# Patient Record
Sex: Male | Born: 2000 | Race: Black or African American | Hispanic: No | Marital: Single | Smoking: Never smoker
Health system: Southern US, Community
[De-identification: ages and names within clinical notes are randomized; demographics above are authoritative.]

## PROBLEM LIST (undated history)

## (undated) DIAGNOSIS — F909 Attention-deficit hyperactivity disorder, unspecified type: Secondary | ICD-10-CM

## (undated) DIAGNOSIS — K219 Gastro-esophageal reflux disease without esophagitis: Secondary | ICD-10-CM

## (undated) DIAGNOSIS — F32A Depression, unspecified: Secondary | ICD-10-CM

## (undated) DIAGNOSIS — H0014 Chalazion left upper eyelid: Secondary | ICD-10-CM

## (undated) DIAGNOSIS — H539 Unspecified visual disturbance: Secondary | ICD-10-CM

## (undated) DIAGNOSIS — T7840XA Allergy, unspecified, initial encounter: Secondary | ICD-10-CM

## (undated) DIAGNOSIS — F419 Anxiety disorder, unspecified: Secondary | ICD-10-CM

## (undated) DIAGNOSIS — I10 Essential (primary) hypertension: Secondary | ICD-10-CM

## (undated) DIAGNOSIS — J45909 Unspecified asthma, uncomplicated: Secondary | ICD-10-CM

## (undated) DIAGNOSIS — R51 Headache: Secondary | ICD-10-CM

## (undated) HISTORY — DX: Anxiety disorder, unspecified: F41.9

## (undated) HISTORY — DX: Depression, unspecified: F32.A

## (undated) HISTORY — PX: TONSILLECTOMY AND ADENOIDECTOMY: SUR1326

## (undated) HISTORY — DX: Gastro-esophageal reflux disease without esophagitis: K21.9

## (undated) HISTORY — DX: Unspecified asthma, uncomplicated: J45.909

## (undated) HISTORY — DX: Essential (primary) hypertension: I10

## (undated) HISTORY — DX: Allergy, unspecified, initial encounter: T78.40XA

---
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
## 2022-02-23 ENCOUNTER — Ambulatory Visit: Payer: Medicaid Other | Admitting: Allergy & Immunology

## 2022-03-17 ENCOUNTER — Ambulatory Visit (INDEPENDENT_AMBULATORY_CARE_PROVIDER_SITE_OTHER): Payer: Medicaid Other | Admitting: Psychiatry

## 2022-03-17 ENCOUNTER — Encounter (HOSPITAL_COMMUNITY): Payer: Self-pay | Admitting: Psychiatry

## 2022-03-17 DIAGNOSIS — F900 Attention-deficit hyperactivity disorder, predominantly inattentive type: Secondary | ICD-10-CM

## 2022-03-17 DIAGNOSIS — F913 Oppositional defiant disorder: Secondary | ICD-10-CM | POA: Diagnosis not present

## 2022-03-17 MED ORDER — METHYLPHENIDATE HCL ER (OSM) 36 MG PO TBCR: 36.0000 mg | EXTENDED_RELEASE_TABLET | ORAL | 0 refills | Status: DC

## 2022-03-17 MED ORDER — METHYLPHENIDATE HCL ER (OSM) 36 MG PO TBCR
36.0000 mg | EXTENDED_RELEASE_TABLET | Freq: Every day | ORAL | 0 refills | Status: DC
Start: 2022-03-17 — End: 2022-07-13

## 2022-03-17 NOTE — Progress Notes (Signed)
Virtual Visit via Telephone Note  I connected with Richard Bolton on 03/17/22 at  8:40 AM EST by telephone and verified that I am speaking with the correct person using two identifiers.  Location: Patient: home Provider: home office   I discussed the limitations, risks, security and privacy concerns of performing an evaluation and management service by telephone and the availability of in person appointments. I also discussed with the patient that there may be a patient responsible charge related to this service. The patient expressed understanding and agreed to proceed.     I discussed the assessment and treatment plan with the patient. The patient was provided an opportunity to ask questions and all were answered. The patient agreed with the plan and demonstrated an understanding of the instructions.   The patient was advised to call back or seek an in-person evaluation if the symptoms worsen or if the condition fails to improve as anticipated.  I provided 15 minutes of non-face-to-face time during this encounter.   Diannia Ruder, MD  [facility] MD/PA/NP OP Progress Note  03/17/2022 9:05 AM Richard Bolton  MRN:  [id number]  Chief Complaint:  Chief Complaint  Patient presents with   ADHD   Depression   Follow-up   HPI: This patient is a 21 year old black male who lives with his parents and 8 year old brother in [place].  He is a Printmaker at [employer].  The patient and father return for follow-up after 3 months.  According to the father the patient is not doing well.  He is disobedient disrespectful.  He has been cursing at his parents.  He got a speeding ticket.  His friend's mother has pain to the money to get an attorney foot to help with the ticket.  He is not working and not contributing financially.  He denies significant depression or anxiety and states that he stopped the Prozac several months ago on it and he does not feel any better or worse.  He claims he is taking the  Concerta but his father is not so sure.  He claims he is getting B's and C's in school but again the father does not know because it he will reveal his grades.  He states that he is sleeping well.  It sounds as if he is not handling his freedom very well and the parents need to put in more stricter controls.  The father is working on this.  We will also get him back into counseling. Visit Diagnosis:    ICD-10-CM   1. Attention deficit hyperactivity disorder (ADHD), predominantly inattentive type  F90.0     2. Oppositional defiant disorder  F91.3       Past Psychiatric History: Past outpatient treatment for ADHD  Past Medical History:  Past Medical History:  Diagnosis Date   ADHD (attention deficit hyperactivity disorder)    Allergy    Phreesia 03/23/2020   Anxiety    Phreesia 05/05/2020   Asthma    Asthma    Phreesia 03/23/2020   Chalazion of left upper eyelid    Constipation    Depression    Phreesia 03/23/2020   Depression    Phreesia 05/05/2020   Gastroesophageal reflux    Headache(784.0)    Hypertension    Phreesia 03/23/2020   Vision abnormalities    wears glasses    Past Surgical History:  Procedure Laterality Date   CHALAZION EXCISION  08/09/2011   Procedure: EXCISION CHALAZION;  Surgeon: Corinda Gubler, MD;  Location: [facility];  Service: Ophthalmology;  Laterality: Left;  chalazion excision and curettage on left upper lid under general anethesia   REVISION ADENOIDECTOMY / ATTEMPTED RIGHT MAXILLARY SINUS TAP  06-25-2008   CHRONIC SINUSITIS/ ADENOID HYPERTROPHY   TONSILLECTOMY AND ADENOIDECTOMY      Family Psychiatric History: See below  Family History:  Family History  Problem Relation Age of Onset   Depression Mother    Anxiety disorder Mother    GER disease Father    Hypertension Father    Hydrocephalus Father        Has shunt   Allergic rhinitis Father    Asthma Brother    Allergic rhinitis Brother    ADD / ADHD Brother     Autism Brother    Arthritis Maternal Aunt    Diabetes Maternal Aunt    Hypertension Maternal Aunt    Learning disabilities Maternal Aunt    Bipolar disorder Paternal Aunt    Bipolar disorder Paternal Aunt    Schizophrenia Paternal Aunt    Asthma Maternal Grandmother    Diabetes Maternal Grandmother    Hypertension Maternal Grandmother    Kidney disease Maternal Grandmother    Alcohol abuse Maternal Grandfather    Heart disease Maternal Grandfather    Hyperlipidemia Paternal Grandmother    Allergic rhinitis Paternal Grandmother    ADD / ADHD Cousin    ADD / ADHD Cousin     Social History:  Social History   Socioeconomic History   Marital status: Single    Spouse name: Not on file   Number of children: Not on file   Years of education: Not on file   Highest education level: Not on file  Occupational History   Not on file  Tobacco Use   Smoking status: Never    Passive exposure: Never   Smokeless tobacco: Never  Vaping Use   Vaping Use: Never used  Substance and Sexual Activity   Alcohol use: No   Drug use: No   Sexual activity: Never  Other Topics Concern   Not on file  Social History Narrative   Lives with mom, dad, brother, and 2 dogs   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    He would like to go to [employer] for [employer].    Social Determinants of Health   Financial Resource Strain: Not on file  Food Insecurity: Not on file  Transportation Needs: Not on file  Physical Activity: Not on file  Stress: Not on file  Social Connections: Not on file    Allergies: No Known Allergies  Metabolic Disorder Labs: No results found for: "HGBA1C", "MPG" No results found for: "PROLACTIN" No results found for: "CHOL", "TRIG", "HDL", "CHOLHDL", "VLDL", "LDLCALC" Lab Results  Component Value Date   TSH 1.55 12/04/2016    Therapeutic Level Labs: No results found for: "LITHIUM" No results found for: "VALPROATE" No results found for: "CBMZ"  Current  Medications: Current Outpatient Medications  Medication Sig Dispense Refill   albuterol (VENTOLIN HFA) 108 (90 Base) MCG/ACT inhaler Inhale 2 puffs into the lungs every 6 (six) hours as needed for wheezing or shortness of breath. 2 each 1   amitriptyline (ELAVIL) 25 MG tablet Take 1 tablet (25 mg total) by mouth at bedtime. 30 tablet 5   amLODipine (NORVASC) 5 MG tablet Take 5 mg by mouth daily.     EPINEPHrine (EPIPEN 2-PAK) 0.3 mg/0.3 mL IJ SOAJ injection Inject 0.3 mg into the muscle as needed for anaphylaxis. 4 each  1   fluticasone (FLOVENT HFA) 110 MCG/ACT inhaler Inhale 2 puffs into the lungs 2 (two) times daily. 12 g 5   hydrochlorothiazide (HYDRODIURIL) 12.5 MG tablet Take 12.5 mg by mouth daily.     ibuprofen (ADVIL) 200 MG tablet Take 3 tablets (600 mg total) by mouth every 6 (six) hours as needed for headache. 30 tablet 0   loratadine (CLARITIN) 10 MG tablet Take 1 tablet (10 mg total) by mouth daily as needed for allergies or rhinitis. 30 tablet 2   methylphenidate (CONCERTA) 36 MG PO CR tablet Take 1 tablet (36 mg total) by mouth daily. 30 tablet 0   methylphenidate (CONCERTA) 36 MG PO CR tablet Take 1 tablet (36 mg total) by mouth every morning. 30 tablet 0   methylphenidate (CONCERTA) 36 MG PO CR tablet Take 1 tablet (36 mg total) by mouth every morning. 30 tablet 0   No current facility-administered medications for this visit.     Musculoskeletal: Strength & Muscle Tone: na Gait & Station: na Patient leans: N/A  Psychiatric Specialty Exam: Review of Systems  Psychiatric/Behavioral:  Positive for behavioral problems.   All other systems reviewed and are negative.   There were no vitals taken for this visit.There is no height or weight on file to calculate BMI.  General Appearance: NA  Eye Contact:  NA  Speech:  Clear and Coherent  Volume:  Normal  Mood:  Irritable  Affect:  NA  Thought Process:  Goal Directed  Orientation:  Full (Time, Place, and Person)  Thought  Content: WDL   Suicidal Thoughts:  No  Homicidal Thoughts:  No  Memory:  Immediate;   Good Recent;   Good Remote;   NA  Judgement:  Poor  Insight:  Lacking  Psychomotor Activity:  Normal  Concentration:  Concentration: Fair and Attention Span: Fair  Recall:  [employer] of Knowledge: Fair  Language: Good  Akathisia:  No  Handed:  Right  AIMS (if indicated): not done  Assets:  Manufacturing systems engineer Physical Health Resilience Social Support  ADL's:  Intact  Cognition: Impaired,  Mild  Sleep:  Good   Screenings: PHQ2-9    Flowsheet Row [facility] Office Visit from 11/15/2021 [facility] Video Visit from 08/12/2021 [facility] Video Visit from 06/25/2020 [facility]  PHQ-2 Total Score 0 1 0 0      Flowsheet Row Office Visit from 12/16/2021 [facility] Office Visit from 11/15/2021 [facility] Video Visit from 08/12/2021 [facility]  [facility]-SSRS RISK CATEGORY No Risk No Risk No Risk        Assessment and Plan: Patient is a 21 year old male with a history of learning disabilities ADHD speech delays.  He claims he is no longer depressed.  He is trying to exercise as Freedom in a very negative way.  For now he will continue Concerta 36 mg every morning for ADHD.  Given that he is no longer child is difficult to force him to take medication.  He will return to see me in 3 months  Collaboration of Care: Collaboration of Care: Referral or follow-up with counselor/therapist AEB patient will restart therapy with Suzan Garibaldi in our office  Patient/Guardian was advised Release of Information must be obtained prior to any record release in order to  collaborate their care with an  outside provider. Patient/Guardian was advised if they have not already done so to contact the registration department to sign all necessary forms in order for [place] to release information regarding their care.   Consent: Patient/Guardian gives verbal consent for treatment and assignment of benefits for services provided during this visit. Patient/Guardian expressed understanding and agreed to proceed.    Diannia Ruder, MD 03/17/2022, 9:05 AM

## 2022-04-14 ENCOUNTER — Ambulatory Visit: Payer: Medicaid Other | Admitting: Allergy & Immunology

## 2022-04-17 ENCOUNTER — Ambulatory Visit (HOSPITAL_COMMUNITY): Payer: Medicaid Other | Admitting: Clinical

## 2022-06-10 ENCOUNTER — Other Ambulatory Visit: Payer: Self-pay

## 2022-06-10 ENCOUNTER — Encounter (HOSPITAL_COMMUNITY): Payer: Self-pay

## 2022-06-10 ENCOUNTER — Emergency Department (HOSPITAL_COMMUNITY)
Admission: EM | Admit: 2022-06-10 | Discharge: 2022-06-10 | Disposition: A | Discharge status: Home / Self Care | Payer: Medicaid Other | Attending: Emergency Medicine | Admitting: Emergency Medicine

## 2022-06-10 DIAGNOSIS — J029 Acute pharyngitis, unspecified: Secondary | ICD-10-CM

## 2022-06-10 DIAGNOSIS — J020 Streptococcal pharyngitis: Secondary | ICD-10-CM | POA: Diagnosis not present

## 2022-06-10 DIAGNOSIS — Z1152 Encounter for screening for COVID-19: Secondary | ICD-10-CM | POA: Diagnosis not present

## 2022-06-10 LAB — RESP PANEL BY RT-PCR (RSV, FLU A&B, COVID)  RVPGX2
Influenza A by PCR: NEGATIVE
Influenza B by PCR: NEGATIVE
Resp Syncytial Virus by PCR: NEGATIVE
SARS Coronavirus 2 by RT PCR: NEGATIVE

## 2022-06-10 LAB — GROUP A STREP BY PCR: Group A Strep by PCR: NOT DETECTED

## 2022-06-10 NOTE — ED Triage Notes (Signed)
Pt c/o sore throat that started yesterday with some nonproductive cough. Afebrile.

## 2022-06-10 NOTE — Discharge Instructions (Signed)
Your strep test and your respiratory panel are negative suggesting that your symptoms are secondary to a viral sore throat which should resolve on its own.  I do recommend taking ibuprofen or Tylenol to help you with any throat pain while this infection is run its course.

## 2022-06-10 NOTE — ED Provider Notes (Signed)
[facility] Provider Note   CSN: [id number] Arrival date & time: 06/10/22  [id number]     History  Chief Complaint  Patient presents with   Sore Throat    Richard Bolton is a 22 y.o. male.  The history is provided by the patient.  Sore Throat This is a new problem. The current episode started 12 to 24 hours ago. The problem occurs constantly. The problem has not changed since onset.Pertinent negatives include no chest pain, no abdominal pain and no shortness of breath. Associated symptoms comments: He denies nasal congestion, rhinorrhea, cough, shortness of breath, headaches or fever.. The symptoms are aggravated by swallowing. Nothing relieves the symptoms. He has tried nothing for the symptoms.       Home Medications Prior to Admission medications   Medication Sig Start Date End Date Taking? Authorizing Provider  albuterol (VENTOLIN HFA) 108 (90 Base) MCG/ACT inhaler Inhale 2 puffs into the lungs every 6 (six) hours as needed for wheezing or shortness of breath. 08/23/21   Valentina Shaggy, MD  amitriptyline (ELAVIL) 25 MG tablet Take 1 tablet (25 mg total) by mouth at bedtime. 05/12/21   Teressa Lower, MD  amLODipine (NORVASC) 5 MG tablet Take 5 mg by mouth daily. 11/12/19 11/11/20  [provider]  EPINEPHrine (EPIPEN 2-PAK) 0.3 mg/0.3 mL IJ SOAJ injection Inject 0.3 mg into the muscle as needed for anaphylaxis. 08/23/21   Valentina Shaggy, MD  fluticasone (FLOVENT HFA) 110 MCG/ACT inhaler Inhale 2 puffs into the lungs 2 (two) times daily. 08/23/21   Valentina Shaggy, MD  hydrochlorothiazide (HYDRODIURIL) 12.5 MG tablet Take 12.5 mg by mouth daily. 02/11/20 02/10/21  [provider]  ibuprofen (ADVIL) 200 MG tablet Take 3 tablets (600 mg total) by mouth every 6 (six) hours as needed for headache. 09/17/20   Georga Hacking, MD  loratadine (CLARITIN) 10 MG tablet Take 1 tablet (10 mg total) by mouth daily as  needed for allergies or rhinitis. 08/10/20   Georga Hacking, MD  methylphenidate (CONCERTA) 36 MG PO CR tablet Take 1 tablet (36 mg total) by mouth daily. 03/17/22 03/17/23  Cloria Spring, MD  methylphenidate (CONCERTA) 36 MG PO CR tablet Take 1 tablet (36 mg total) by mouth every morning. 03/17/22 03/17/23  Cloria Spring, MD  methylphenidate (CONCERTA) 36 MG PO CR tablet Take 1 tablet (36 mg total) by mouth every morning. 03/17/22   Cloria Spring, MD      Allergies    Patient has no known allergies.    Review of Systems   Review of Systems  Constitutional:  Negative for chills and fever.  HENT:  Positive for sore throat. Negative for congestion, dental problem, ear pain, rhinorrhea, sinus pressure, trouble swallowing and voice change.   Eyes:  Negative for discharge.  Respiratory:  Negative for cough, shortness of breath, wheezing and stridor.   Cardiovascular:  Negative for chest pain.  Gastrointestinal:  Negative for abdominal pain.  Genitourinary: Negative.   All other systems reviewed and are negative.   Physical Exam Updated Vital Signs BP (!) 141/98   Pulse 64   Temp 97.7 F (36.5 C) (Oral)   Resp 17   Ht 5' 10"$  (1.778 m)   Wt 81.6 kg   SpO2 100%   BMI 25.83 kg/m  Physical Exam Constitutional:      Appearance: He is well-developed.  HENT:     Head: Normocephalic and atraumatic.  Right Ear: Tympanic membrane and ear canal normal.     Left Ear: Tympanic membrane and ear canal normal.     Nose: No mucosal edema or rhinorrhea.     Mouth/Throat:     Mouth: Mucous membranes are moist.     Pharynx: Oropharynx is clear. Uvula midline. Posterior oropharyngeal erythema present. No pharyngeal swelling, oropharyngeal exudate or uvula swelling.     Tonsils: No tonsillar abscesses.     Comments: Mild posterior pharyngeal erythema, no exudate.  No head or neck adenopathy. Eyes:     Conjunctiva/sclera: Conjunctivae normal.  Cardiovascular:     Rate and Rhythm: Normal  rate.     Heart sounds: Normal heart sounds.  Pulmonary:     Effort: Pulmonary effort is normal. No respiratory distress.     Breath sounds: No wheezing or rales.  Musculoskeletal:        General: Normal range of motion.  Skin:    General: Skin is warm and dry.     Findings: No rash.  Neurological:     Mental Status: He is alert and oriented to person, place, and time.     ED Results / Procedures / Treatments   Labs (all labs ordered are listed, but only abnormal results are displayed) Labs Reviewed  GROUP A STREP BY PCR  RESP PANEL BY RT-PCR (RSV, FLU A&B, COVID)  [id number]    EKG None  Radiology No results found.  Procedures Procedures    Medications Ordered in ED Medications - No data to display  ED Course/ Medical Decision Making/ A&P                             Medical Decision Making Patient woke today with a sore throat, no other complaint of pain or symptoms.  Respiratory panel and strep panel are negative, his exam suggest a viral pharyngitis.  He was given home care instructions.  Return precautions were outlined.  Amount and/or Complexity of Data Reviewed Labs: ordered.    Details: Strep negative, respiratory panel negative           Final Clinical Impression(s) / ED Diagnoses Final diagnoses:  Viral pharyngitis    Rx / DC Orders ED Discharge Orders     None         Landis Martins 06/10/22 1030    Davonna Belling, MD 06/10/22 1525

## 2022-06-16 ENCOUNTER — Telehealth (HOSPITAL_COMMUNITY): Payer: No Typology Code available for payment source | Admitting: Psychiatry

## 2022-06-29 ENCOUNTER — Encounter: Payer: Self-pay | Admitting: Radiology

## 2022-07-13 ENCOUNTER — Encounter (HOSPITAL_COMMUNITY): Payer: Self-pay | Admitting: Psychiatry

## 2022-07-13 ENCOUNTER — Telehealth (HOSPITAL_COMMUNITY): Payer: No Typology Code available for payment source | Admitting: Psychiatry

## 2022-07-13 DIAGNOSIS — F900 Attention-deficit hyperactivity disorder, predominantly inattentive type: Secondary | ICD-10-CM

## 2022-07-13 NOTE — Progress Notes (Signed)
Virtual Visit via Telephone Note  I connected with Marland Mcalpine on 07/13/22 at  9:40 AM EDT by telephone and verified that I am speaking with the correct person using two identifiers.  Location: Patient: home Provider: office   I discussed the limitations, risks, security and privacy concerns of performing an evaluation and management service by telephone and the availability of in person appointments. I also discussed with the patient that there may be a patient responsible charge related to this service. The patient expressed understanding and agreed to proceed.     I discussed the assessment and treatment plan with the patient. The patient was provided an opportunity to ask questions and all were answered. The patient agreed with the plan and demonstrated an understanding of the instructions.   The patient was advised to call back or seek an in-person evaluation if the symptoms worsen or if the condition fails to improve as anticipated.  I provided 15 minutes of non-face-to-face time during this encounter.   Levonne Spiller, MD  [facility] MD/PA/NP OP Progress Note  07/13/2022 10:00 AM Maximilliano Wheeler  MRN:  [id number]  Chief Complaint:  Chief Complaint  Patient presents with   ADHD   Follow-up   HPI: This patient is a 22 year old black male who lives with his parents and older brother in [place].  He is a Museum/gallery exhibitions officer at [employer].  The patient and father return after 4 months.  The father states that the patient has been very secretive but not telling the parents much about his life.  He is going to college and he does not tell the parents how he is doing there.  When I spoke to the patient himself he admitted he has not been taking any of his medications.  He is not taking the Concerta for ADHD nor is he taking any of his medicine for blood pressure or headaches.  He denies depression anxiety or thoughts of self-harm.  He claims that he is focusing without his medications.  Apparently he  is going to move in with his friend and the friend's mother fairly soon.  At that point he will no longer be able to use the family vehicle and the father does not know how he will get to college but he has determined that there is nothing more he can do as the patient wants to make his own decisions.  I explained that since he is not wanting to take any medications right now there is no point in returning to the office unless he makes a decision to come back. Visit Diagnosis:    ICD-10-CM   1. Attention deficit hyperactivity disorder (ADHD), predominantly inattentive type  F90.0       Past Psychiatric History: Past outpatient treatment for ADHD  Past Medical History:  Past Medical History:  Diagnosis Date   ADHD (attention deficit hyperactivity disorder)    Allergy    Phreesia 03/23/2020   Anxiety    Phreesia 05/05/2020   Asthma    Asthma    Phreesia 03/23/2020   Chalazion of left upper eyelid    Constipation    Depression    Phreesia 03/23/2020   Depression    Phreesia 05/05/2020   Gastroesophageal reflux    Headache(784.0)    Hypertension    Phreesia 03/23/2020   Vision abnormalities    wears glasses    Past Surgical History:  Procedure Laterality Date   CHALAZION EXCISION  08/09/2011   Procedure: EXCISION CHALAZION;  Surgeon: Dara Hoyer,  MD;  Location: [place];  Service: Ophthalmology;  Laterality: Left;  chalazion excision and curettage on left upper lid under general anethesia   REVISION ADENOIDECTOMY / ATTEMPTED RIGHT MAXILLARY SINUS TAP  06-25-2008   CHRONIC SINUSITIS/ ADENOID HYPERTROPHY   TONSILLECTOMY AND ADENOIDECTOMY      Family Psychiatric History: See below  Family History:  Family History  Problem Relation Age of Onset   Depression Mother    Anxiety disorder Mother    GER disease Father    Hypertension Father    Hydrocephalus Father        Has shunt   Allergic rhinitis Father    Asthma Brother    Allergic rhinitis Brother     ADD / ADHD Brother    Autism Brother    Arthritis Maternal Aunt    Diabetes Maternal Aunt    Hypertension Maternal Aunt    Learning disabilities Maternal Aunt    Bipolar disorder Paternal Aunt    Bipolar disorder Paternal Aunt    Schizophrenia Paternal Aunt    Asthma Maternal Grandmother    Diabetes Maternal Grandmother    Hypertension Maternal Grandmother    Kidney disease Maternal Grandmother    Alcohol abuse Maternal Grandfather    Heart disease Maternal Grandfather    Hyperlipidemia Paternal Grandmother    Allergic rhinitis Paternal Grandmother    ADD / ADHD Cousin    ADD / ADHD Cousin     Social History:  Social History   Socioeconomic History   Marital status: Single    Spouse name: Not on file   Number of children: Not on file   Years of education: Not on file   Highest education level: Not on file  Occupational History   Not on file  Tobacco Use   Smoking status: Never    Passive exposure: Never   Smokeless tobacco: Never  Vaping Use   Vaping Use: Never used  Substance and Sexual Activity   Alcohol use: No   Drug use: No   Sexual activity: Yes  Other Topics Concern   Not on file  Social History Narrative   Lives with mom, dad, brother, and 2 dogs   He is a Equities trader at [employer].    He would like to go to [employer].    Social Determinants of Health   Financial Resource Strain: Not on file  Food Insecurity: Not on file  Transportation Needs: Not on file  Physical Activity: Not on file  Stress: Not on file  Social Connections: Not on file    Allergies: No Known Allergies  Metabolic Disorder Labs: No results found for: "HGBA1C", "MPG" No results found for: "PROLACTIN" No results found for: "CHOL", "TRIG", "HDL", "CHOLHDL", "VLDL", "LDLCALC" Lab Results  Component Value Date   TSH 1.55 12/04/2016    Therapeutic Level Labs: No results found for: "LITHIUM" No results found for: "VALPROATE" No results found for:  "CBMZ"  Current Medications: Current Outpatient Medications  Medication Sig Dispense Refill   albuterol (VENTOLIN HFA) 108 (90 Base) MCG/ACT inhaler Inhale 2 puffs into the lungs every 6 (six) hours as needed for wheezing or shortness of breath. 2 each 1   amLODipine (NORVASC) 5 MG tablet Take 5 mg by mouth daily.     EPINEPHrine (EPIPEN 2-PAK) 0.3 mg/0.3 mL IJ SOAJ injection Inject 0.3 mg into the muscle as needed for anaphylaxis. 4 each 1   fluticasone (FLOVENT HFA) 110 MCG/ACT inhaler Inhale 2 puffs into the  lungs 2 (two) times daily. 12 g 5   hydrochlorothiazide (HYDRODIURIL) 12.5 MG tablet Take 12.5 mg by mouth daily.     ibuprofen (ADVIL) 200 MG tablet Take 3 tablets (600 mg total) by mouth every 6 (six) hours as needed for headache. 30 tablet 0   No current facility-administered medications for this visit.     Musculoskeletal: Strength & Muscle Tone: na Gait & Station: na Patient leans: N/A  Psychiatric Specialty Exam: Review of Systems  All other systems reviewed and are negative.   There were no vitals taken for this visit.There is no height or weight on file to calculate BMI.  General Appearance: NA  Eye Contact:  NA  Speech:  Clear and Coherent  Volume:  Normal  Mood:  Euthymic  Affect:  NA  Thought Process:  Goal Directed  Orientation:  Full (Time, Place, and Person)  Thought Content: WDL   Suicidal Thoughts:  No  Homicidal Thoughts:  No  Memory:  Immediate;   Good Recent;   Good Remote;   NA  Judgement:  Poor  Insight:  Lacking  Psychomotor Activity:  NA  Concentration:  Concentration: Fair and Attention Span: Fair  Recall:  [employer] of Knowledge: Fair  Language: Good  Akathisia:  No  Handed:  Right  AIMS (if indicated): not done  Assets:  Communication Skills Desire for Improvement Physical Health Resilience Social Support  ADL's:  Intact  Cognition: WNL  Sleep:  Good   Screenings: PHQ2-9    [place] Office Visit from 12/16/2021 in  [place] at [place] from 11/15/2021 in [place] at [place] Video Visit from 08/12/2021 in [place] at [place] Visit from 06/25/2020 in [place] at [facility] Total Score 0 1 0 0      [place] ED from 06/10/2022 in [facility] Emergency Department at [facility] Office Visit from 12/16/2021 in [place] at [place] Visit from 11/15/2021 in [place] at [place] No Risk No Risk No Risk        Assessment and Plan: This patient is a 22 year old male with a history of learning disabilities ADHD and speech delays.  He is no longer taking any medication and declines further use of medicines.  He wants to make his own decisions and given that he is over 18 his parents really have no control over this.  At this point I do not see any reason for him to come back here unless he wants to resume medication treatment.  He is welcome to come back at any point.  Collaboration of Care: Collaboration of Care: Other patient currently does not have a primary care provider and given his history of hypertension I suggested to the father try to help him with this if he is willing  Patient/Guardian was advised Release of Information must be obtained prior to any record release in order to collaborate their care with an outside provider. Patient/Guardian was advised if they have not already done so to contact the registration department to sign all necessary forms in order for [place] to release information regarding their care.   Consent: Patient/Guardian gives verbal consent for treatment and assignment of benefits for services provided during this visit. Patient/Guardian expressed understanding and agreed to proceed.    Levonne Spiller, MD 07/13/2022, 10:00 AM

## 2022-10-30 ENCOUNTER — Emergency Department (HOSPITAL_COMMUNITY): Payer: MEDICAID

## 2022-10-30 ENCOUNTER — Other Ambulatory Visit: Payer: Self-pay

## 2022-10-30 ENCOUNTER — Emergency Department (HOSPITAL_COMMUNITY)
Admission: EM | Admit: 2022-10-30 | Discharge: 2022-10-31 | Disposition: A | Discharge status: Home / Self Care | Payer: MEDICAID | Attending: Emergency Medicine | Admitting: Emergency Medicine

## 2022-10-30 ENCOUNTER — Encounter (HOSPITAL_COMMUNITY): Payer: Self-pay | Admitting: Emergency Medicine

## 2022-10-30 DIAGNOSIS — X501XXA Overexertion from prolonged static or awkward postures, initial encounter: Secondary | ICD-10-CM | POA: Diagnosis not present

## 2022-10-30 DIAGNOSIS — Z79899 Other long term (current) drug therapy: Secondary | ICD-10-CM | POA: Diagnosis not present

## 2022-10-30 DIAGNOSIS — I10 Essential (primary) hypertension: Secondary | ICD-10-CM | POA: Insufficient documentation

## 2022-10-30 DIAGNOSIS — Y9367 Activity, basketball: Secondary | ICD-10-CM | POA: Insufficient documentation

## 2022-10-30 DIAGNOSIS — J45909 Unspecified asthma, uncomplicated: Secondary | ICD-10-CM | POA: Diagnosis not present

## 2022-10-30 DIAGNOSIS — M79672 Pain in left foot: Secondary | ICD-10-CM | POA: Diagnosis not present

## 2022-10-30 DIAGNOSIS — Z7951 Long term (current) use of inhaled steroids: Secondary | ICD-10-CM | POA: Diagnosis not present

## 2022-10-30 DIAGNOSIS — M25572 Pain in left ankle and joints of left foot: Secondary | ICD-10-CM | POA: Diagnosis present

## 2022-10-30 NOTE — ED Triage Notes (Signed)
Pt via POV c/o left ankle pain after rolling his ankle during a basketball game at around 7pm tonight. Ankle looks swollen but no gross deformity noted. Pain rated 10/10 with no meds PTA.

## 2022-10-31 MED ORDER — IBUPROFEN 800 MG PO TABS
800.0000 mg | ORAL_TABLET | Freq: Once | ORAL | Status: AC
  Administered 2022-10-31: 800 mg via ORAL
  Filled 2022-10-31: qty 1

## 2022-10-31 NOTE — Discharge Instructions (Signed)
You were seen today for ankle and foot pain.  Your x-rays do not show any evidence of broken bones.  You may have a sprain.  If you continue to have point tenderness, follow-up with orthopedics.  You were provided with an ankle brace and may bear weight as tolerated.  Crutches are provided for comfort.  Make sure to ice and elevate.  Take ibuprofen for pain.

## 2022-10-31 NOTE — ED Notes (Signed)
Patient refuses crutches stating he has some at home

## 2022-10-31 NOTE — ED Provider Notes (Signed)
EMERGENCY DEPARTMENT AT [facility] Provider Note   CSN: [id number] Arrival date & time: 10/30/22  2022     History  Chief Complaint  Patient presents with   Ankle Pain    Richard Bolton is a 22 y.o. male.  HPI     This is a 22 year old male who presents with left ankle and foot pain.  Patient reports that he rolled his ankle while playing basketball.  He has difficulty bearing weight secondary to pain.  He has not taken anything for his pain.  Denies any other injury.  Denies knee pain.  Denies numbness or tingling.  Home Medications Prior to Admission medications   Medication Sig Start Date End Date Taking? Authorizing Provider  albuterol (VENTOLIN HFA) 108 (90 Base) MCG/ACT inhaler Inhale 2 puffs into the lungs every 6 (six) hours as needed for wheezing or shortness of breath. 08/23/21   Alfonse Spruce, MD  amLODipine (NORVASC) 5 MG tablet Take 5 mg by mouth daily. 11/12/19 11/11/20  [provider]  EPINEPHrine (EPIPEN 2-PAK) 0.3 mg/0.3 mL IJ SOAJ injection Inject 0.3 mg into the muscle as needed for anaphylaxis. 08/23/21   Alfonse Spruce, MD  fluticasone (FLOVENT HFA) 110 MCG/ACT inhaler Inhale 2 puffs into the lungs 2 (two) times daily. 08/23/21   Alfonse Spruce, MD  hydrochlorothiazide (HYDRODIURIL) 12.5 MG tablet Take 12.5 mg by mouth daily. 02/11/20 02/10/21  [provider]  ibuprofen (ADVIL) 200 MG tablet Take 3 tablets (600 mg total) by mouth every 6 (six) hours as needed for headache. 09/17/20   Ancil Linsey, MD      Allergies    Patient has no known allergies.    Review of Systems   Review of Systems  Musculoskeletal:        Left foot and ankle pain  All other systems reviewed and are negative.   Physical Exam Updated Vital Signs BP (!) 149/84   Pulse 64   Temp 98.9 F (37.2 C) (Oral)   Resp 18   Ht 1.778 m (5\' 10" )   Wt 81.6 kg   SpO2 100%   BMI 25.83 kg/m  Physical Exam Vitals and  nursing note reviewed.  Constitutional:      Appearance: He is well-developed. He is not ill-appearing.  HENT:     Head: Normocephalic and atraumatic.  Cardiovascular:     Rate and Rhythm: Normal rate and regular rhythm.     Heart sounds: No murmur heard. Pulmonary:     Effort: Pulmonary effort is normal. No respiratory distress.     Breath sounds: Normal breath sounds.  Abdominal:     Palpations: Abdomen is soft.  Musculoskeletal:     Comments: Focused examination of the left foot and ankle with tenderness to palpation over the lateral malleolus into the proximal foot, no overlying skin changes, no significant swelling or deformity noted, 2+ DP pulse  Skin:    General: Skin is warm and dry.  Neurological:     Mental Status: He is alert and oriented to person, place, and time.     ED Results / Procedures / Treatments   Labs (all labs ordered are listed, but only abnormal results are displayed) Labs Reviewed - No data to display  EKG None  Radiology DG Foot Complete Left  Result Date: 10/30/2022 CLINICAL DATA:  Pain EXAM: LEFT FOOT - COMPLETE 3+ VIEW COMPARISON:  None Available. FINDINGS: No fracture or dislocation is seen. The joint spaces are preserved.  The visualized soft tissues are unremarkable. IMPRESSION: Negative. Electronically Signed   By: Charline Bills M.D.   On: 10/30/2022 22:17   DG Ankle Left Port  Result Date: 10/30/2022 CLINICAL DATA:  Pain EXAM: PORTABLE LEFT ANKLE - 2 VIEW COMPARISON:  None Available. FINDINGS: No fracture or dislocation is seen. Tiny osseous fragment adjacent to the medial cuneiform is well corticated and favors an accessory os. The ankle mortise is intact. The base of the fifth metatarsal is unremarkable. The visualized soft tissues are unremarkable. IMPRESSION: Negative. Electronically Signed   By: Charline Bills M.D.   On: 10/30/2022 22:17    Procedures Procedures    Medications Ordered in ED Medications  ibuprofen (ADVIL) tablet  800 mg (has no administration in time range)    ED Course/ Medical Decision Making/ A&P                             Medical Decision Making Amount and/or Complexity of Data Reviewed Radiology: ordered.  Risk Prescription drug management.   This patient presents to the ED for concern of left foot and ankle pain, this involves an extensive number of treatment options, and is a complaint that carries with it a high risk of complications and morbidity.  I considered the following differential and admission for this acute, potentially life threatening condition.  The differential diagnosis includes fracture, sprain, contusion  MDM:    This is a 22 year old male who presents with left foot and ankle pain.  He is nontoxic and vital signs are reassuring.  Patient has some tenderness over the lateral malleolus into the dorsum of the foot.  No obvious deformities.  X-rays negative.  I specifically reviewed films to correlate with point tenderness and there is no obvious fracture.  Patient was provided with an ankle splint.  Recommend rest, ice, compression, elevation.  Orthopedic follow-up provided.  (Labs, imaging, consults)  Labs: I Ordered, and personally interpreted labs.  The pertinent results include: None  Imaging Studies ordered: I ordered imaging studies including x-rays I independently visualized and interpreted imaging. I agree with the radiologist interpretation  Additional history obtained from chart review.  External records from outside source obtained and reviewed including prior evaluations  Cardiac Monitoring: The patient was not maintained on a cardiac monitor.  If on the cardiac monitor, I personally viewed and interpreted the cardiac monitored which showed an underlying rhythm of: N/A  Reevaluation: After the interventions noted above, I reevaluated the patient and found that they have :stayed the same  Social Determinants of Health:  lives  independently  Disposition: Discharge  Co morbidities that complicate the patient evaluation  Past Medical History:  Diagnosis Date   ADHD (attention deficit hyperactivity disorder)    Allergy    Phreesia 03/23/2020   Anxiety    Phreesia 05/05/2020   Asthma    Asthma    Phreesia 03/23/2020   Chalazion of left upper eyelid    Constipation    Depression    Phreesia 03/23/2020   Depression    Phreesia 05/05/2020   Gastroesophageal reflux    Headache(784.0)    Hypertension    Phreesia 03/23/2020   Vision abnormalities    wears glasses     Medicines Meds ordered this encounter  Medications   ibuprofen (ADVIL) tablet 800 mg    I have reviewed the patients home medicines and have made adjustments as needed  Problem List / ED Course: Problem List Items  Addressed This Visit   None Visit Diagnoses     Acute left ankle pain    -  Primary                   Final Clinical Impression(s) / ED Diagnoses Final diagnoses:  Acute left ankle pain    Rx / DC Orders ED Discharge Orders     None         Shon Baton, MD 10/31/22 [phone]
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
